# Patient Record
Sex: Male | Born: 2000 | Race: Black or African American | Hispanic: No | Marital: Single | Smoking: Never smoker
Health system: Southern US, Community
[De-identification: ages and names within clinical notes are randomized; demographics above are authoritative.]

## PROBLEM LIST (undated history)

## (undated) DIAGNOSIS — J45909 Unspecified asthma, uncomplicated: Secondary | ICD-10-CM

---
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
## 2020-03-19 ENCOUNTER — Other Ambulatory Visit: Payer: Self-pay

## 2020-03-19 ENCOUNTER — Ambulatory Visit (HOSPITAL_COMMUNITY)
Admission: RE | Admit: 2020-03-19 | Discharge: 2020-03-19 | Disposition: A | Discharge status: Home / Self Care | Payer: Medicaid Other | Source: Ambulatory Visit | Attending: Family Medicine | Admitting: Family Medicine

## 2020-03-19 ENCOUNTER — Encounter (HOSPITAL_COMMUNITY): Payer: Self-pay

## 2020-03-19 VITALS — BP 122/85 | HR 83 | Temp 98.4°F | Resp 16 | Ht 69.0 in | Wt 160.0 lb

## 2020-03-19 DIAGNOSIS — J209 Acute bronchitis, unspecified: Secondary | ICD-10-CM

## 2020-03-19 MED ORDER — BENZONATATE 100 MG PO CAPS: 100.0000 mg | ORAL_CAPSULE | Freq: Four times a day (QID) | ORAL | 0 refills | Status: AC | PRN

## 2020-03-19 MED ORDER — PREDNISONE 50 MG PO TABS: ORAL_TABLET | ORAL | 0 refills | Status: AC

## 2020-03-19 MED ORDER — ALBUTEROL SULFATE HFA 108 (90 BASE) MCG/ACT IN AERS: 2.0000 | INHALATION_SPRAY | Freq: Four times a day (QID) | RESPIRATORY_TRACT | 0 refills | Status: AC | PRN

## 2020-03-19 MED ORDER — AZITHROMYCIN 250 MG PO TABS: ORAL_TABLET | ORAL | 0 refills | Status: AC

## 2020-03-19 NOTE — ED Triage Notes (Signed)
Pt reports Approx. 10 days ago He started having a cough that was productive , HA ,Nausea. Pt reports he still feels bad.

## 2020-03-19 NOTE — ED Provider Notes (Signed)
[facility]    CSN: [id number] Arrival date & time: 03/19/20  0915      History   Chief Complaint Chief Complaint  Patient presents with  . Cough  . Nausea  . Headache    HPI Tony Evans is a 19 y.o. male with history of childhood asthma presents to urgent care with complaints of feeling unwell.  Patient states feeling unwell since last Monday so this would be day 11 of illness.  He is not feeling any worse today however has not improved therefore prompting him to come to the ER.  Patient reports cough productive of thick, green mucus, dull headache, sinus congestion.  Patient denies any fever or chills, no sore throat, no loss of taste or smell.  Patient fully vaccinated with negative Covid PCR done earlier this week.   History reviewed. No pertinent past medical history.  There are no problems to display for this patient.   History reviewed. No pertinent surgical history.     Home Medications    Prior to Admission medications   Medication Sig Start Date End Date Taking? Authorizing Provider  albuterol (VENTOLIN HFA) 108 (90 Base) MCG/ACT inhaler Inhale 2 puffs into the lungs every 6 (six) hours as needed for wheezing or shortness of breath. 03/19/20   Rolla Etienne, NP  azithromycin (ZITHROMAX Z-PAK) 250 MG tablet Take 2 tablets (500 mg) on  Day 1,  followed by 1 tablet (250 mg) once daily on Days 2 through 5. 03/19/20 03/24/20  Rolla Etienne, NP  benzonatate (TESSALON PERLES) 100 MG capsule Take 1 capsule (100 mg total) by mouth every 6 (six) hours as needed for cough. 03/19/20 03/19/21  Rolla Etienne, NP  predniSONE (DELTASONE) 50 MG tablet 1 tab p.o. daily x5 days 03/19/20   Rolla Etienne, NP    Family History History reviewed. No pertinent family history.  Social History Social History   Tobacco Use  . Smoking status: Never Smoker  . Smokeless tobacco: Never Used  Substance Use Topics  . Alcohol use: Yes    Comment: social  . Drug use: Never      Allergies   Patient has no allergy information on record.   Review of Systems As stated in HPI otherwise negative   Physical Exam Triage Vital Signs ED Triage Vitals  Enc Vitals Group     BP 03/19/20 1009 122/85     Pulse Rate 03/19/20 1009 83     Resp 03/19/20 1009 16     Temp 03/19/20 1009 98.4 F (36.9 C)     Temp Source 03/19/20 1009 Oral     SpO2 03/19/20 1009 100 %     Weight 03/19/20 1011 160 lb (72.6 kg)     Height 03/19/20 1011 5\' 9"  (1.753 m)     Head Circumference --      Peak Flow --      Pain Score 03/19/20 1014 0     Pain Loc --      Pain Edu? --      Excl. in GC? --    No data found.  Updated Vital Signs BP 122/85 (BP Location: Right Arm)   Pulse 83   Temp 98.4 F (36.9 C) (Oral)   Resp 16   Ht 5\' 9"  (1.753 m)   Wt 72.6 kg   SpO2 100%   BMI 23.63 kg/m   Visual Acuity Right Eye Distance:   Left Eye Distance:   Bilateral Distance:  Right Eye Near:   Left Eye Near:    Bilateral Near:     Physical Exam Constitutional:      General: He is not in acute distress.    Appearance: He is well-developed and normal weight. He is not ill-appearing.  HENT:     Mouth/Throat:     Mouth: Mucous membranes are moist.     Pharynx: Oropharynx is clear.  Cardiovascular:     Rate and Rhythm: Normal rate and regular rhythm.  Pulmonary:     Effort: Pulmonary effort is normal.     Comments: Bilateral expiratory wheeze, mild.  No rales or crackles no increased work of breathing Musculoskeletal:     Cervical back: Normal range of motion and neck supple.  Lymphadenopathy:     Cervical: No cervical adenopathy.  Skin:    General: Skin is warm and dry.  Neurological:     Mental Status: He is alert.  Psychiatric:        Mood and Affect: Mood normal.        Behavior: Behavior normal.      UC Treatments / Results  Labs (all labs ordered are listed, but only abnormal results are displayed) Labs Reviewed - No data to  display  EKG   Radiology No results found.  Procedures Procedures (including critical care time)  Medications Ordered in UC Medications - No data to display  Initial Impression / Assessment and Plan / UC Course  I have reviewed the triage vital signs and the nursing notes.  Pertinent labs & imaging results that were available during my care of the patient were reviewed by me and considered in my medical decision making (see chart for details).  Acute Bronchitis -Suspect viral but given duration of symptoms will treat empirically with azithromycin -Prednisone burst, albuterol inhaler as needed, Tessalon Perles as needed -Covid PCR negative earlier this week therefore no need to repeat - follow-up for any worsening symptoms  Reviewed expections re: course of current medical issues. Questions answered. Outlined signs and symptoms indicating need for more acute intervention. Pt verbalized understanding. AVS given   Final Clinical Impressions(s) / UC Diagnoses   Final diagnoses:  Acute bronchitis, unspecified organism     Discharge Instructions     Take antibiotic and steroid as prescribed.  Use albuterol inhaler as needed for any shortness of breath or persistent coughing.  I am also prescribing you Tessalon Perles to suppress cough.  Please follow-up for any worsening symptoms.    ED Prescriptions    Medication Sig Dispense Auth. Provider   azithromycin (ZITHROMAX Z-PAK) 250 MG tablet Take 2 tablets (500 mg) on  Day 1,  followed by 1 tablet (250 mg) once daily on Days 2 through 5. 6 each Rolla Etienne, NP   predniSONE (DELTASONE) 50 MG tablet 1 tab p.o. daily x5 days 5 tablet Rolla Etienne, NP   albuterol (VENTOLIN HFA) 108 (90 Base) MCG/ACT inhaler Inhale 2 puffs into the lungs every 6 (six) hours as needed for wheezing or shortness of breath. 8 g Rolla Etienne, NP   benzonatate (TESSALON PERLES) 100 MG capsule Take 1 capsule (100 mg total) by mouth every 6 (six)  hours as needed for cough. 30 capsule Rolla Etienne, NP     PDMP not reviewed this encounter.   Rolla Etienne, NP 03/19/20 1350

## 2020-03-19 NOTE — Discharge Instructions (Signed)
Take antibiotic and steroid as prescribed.  Use albuterol inhaler as needed for any shortness of breath or persistent coughing.  I am also prescribing you Tessalon Perles to suppress cough.  Please follow-up for any worsening symptoms.

## 2020-04-07 ENCOUNTER — Encounter (HOSPITAL_COMMUNITY): Payer: Self-pay | Admitting: Emergency Medicine

## 2020-04-08 ENCOUNTER — Emergency Department (HOSPITAL_COMMUNITY): Payer: Medicaid Other

## 2020-04-08 ENCOUNTER — Emergency Department (HOSPITAL_COMMUNITY)
Admission: EM | Admit: 2020-04-08 | Discharge: 2020-04-09 | Disposition: A | Discharge status: Home / Self Care | Payer: Medicaid Other | Attending: Emergency Medicine | Admitting: Emergency Medicine

## 2020-04-08 ENCOUNTER — Encounter (HOSPITAL_COMMUNITY): Payer: Self-pay

## 2020-04-08 ENCOUNTER — Other Ambulatory Visit: Payer: Self-pay

## 2020-04-08 DIAGNOSIS — R062 Wheezing: Secondary | ICD-10-CM | POA: Insufficient documentation

## 2020-04-08 DIAGNOSIS — R059 Cough, unspecified: Secondary | ICD-10-CM | POA: Diagnosis present

## 2020-04-08 DIAGNOSIS — R0602 Shortness of breath: Secondary | ICD-10-CM | POA: Insufficient documentation

## 2020-04-08 DIAGNOSIS — Z20822 Contact with and (suspected) exposure to covid-19: Secondary | ICD-10-CM | POA: Insufficient documentation

## 2020-04-08 NOTE — ED Triage Notes (Signed)
Pt states that he was seen at UC two weeks ago and diagnosed with bronchitis, given antibiotics that he completed, coughing, congestion, and SOB continues. Pt is fully vaccinated for covid and had negative covid test two weeks ago.

## 2020-04-09 LAB — RESP PANEL BY RT-PCR (FLU A&B, COVID) ARPGX2
Influenza A by PCR: NEGATIVE
Influenza B by PCR: NEGATIVE
SARS Coronavirus 2 by RT PCR: NEGATIVE

## 2020-04-09 MED ORDER — PREDNISONE 20 MG PO TABS
20.0000 mg | ORAL_TABLET | Freq: Once | ORAL | Status: AC
  Administered 2020-04-09: 20 mg via ORAL
  Filled 2020-04-09: qty 1

## 2020-04-09 MED ORDER — ALBUTEROL SULFATE HFA 108 (90 BASE) MCG/ACT IN AERS
2.0000 | INHALATION_SPRAY | Freq: Once | RESPIRATORY_TRACT | Status: AC
  Administered 2020-04-09: 2 via RESPIRATORY_TRACT
  Filled 2020-04-09: qty 6.7

## 2020-04-09 NOTE — ED Provider Notes (Signed)
[facility] EMERGENCY DEPARTMENT Provider Note  CSN: [id number] Arrival date & time: 04/08/20 2222  Chief Complaint(s) Cough  HPI Tony Evans is a 19 y.o. male with a history of asthma who presents to the emergency department for cough and mild shortness of breath.  He reports that he was diagnosed with bronchitis several weeks ago and given an albuterol inhaler.  This has helped him however several days ago his cough returned.  He is getting relief with intermittent use of albuterol however short lasting.  He denies any fevers or chills.  The cough is dry nonproductive.  No chest pain.  No other physical complaints.  HPI  Past Medical History Past Medical History:  Diagnosis Date  . Asthma    There are no problems to display for this patient.  Home Medication(s) Prior to Admission medications   Medication Sig Start Date End Date Taking? Authorizing Provider  albuterol (VENTOLIN HFA) 108 (90 Base) MCG/ACT inhaler Inhale 2 puffs into the lungs every 6 (six) hours as needed for wheezing or shortness of breath. 03/19/20   Rolla Etienne, NP  benzonatate (TESSALON PERLES) 100 MG capsule Take 1 capsule (100 mg total) by mouth every 6 (six) hours as needed for cough. 03/19/20 03/19/21  Rolla Etienne, NP  LORazepam (ATIVAN) 1 MG tablet Take 1 tablet (1 mg total) by mouth 3 (three) times daily as needed for anxiety. 04/08/18   Ward, Layla Maw, DO  predniSONE (DELTASONE) 50 MG tablet 1 tab p.o. daily x5 days 03/19/20   Rolla Etienne, NP                                                                                                                                    Past Surgical History History reviewed. No pertinent surgical history. Family History No family history on file.  Social History Social History   Tobacco Use  . Smoking status: Never Smoker  . Smokeless tobacco: Never Used  Substance Use Topics  . Alcohol use: Yes    Comment: social  . Drug use: Never    Allergies Banana  Review of Systems Review of Systems All other systems are reviewed and are negative for acute change except as noted in the HPI  Physical Exam Vital Signs  I have reviewed the triage vital signs BP 131/80 (BP Location: Right Arm)   Pulse 94   Temp 98 F (36.7 C) (Oral)   Resp 18   SpO2 99%   Physical Exam Vitals reviewed.  Constitutional:      General: He is not in acute distress.    Appearance: He is well-developed. He is not diaphoretic.  HENT:     Head: Normocephalic and atraumatic.     Nose: Nose normal.  Eyes:     General: No scleral icterus.       Right eye: No discharge.  Left eye: No discharge.     Conjunctiva/sclera: Conjunctivae normal.     Pupils: Pupils are equal, round, and reactive to light.  Cardiovascular:     Rate and Rhythm: Normal rate and regular rhythm.     Heart sounds: No murmur heard.  No friction rub. No gallop.   Pulmonary:     Effort: Pulmonary effort is normal. No respiratory distress.     Breath sounds: No stridor. Examination of the right-middle field reveals wheezing. Examination of the left-middle field reveals wheezing. Examination of the right-lower field reveals wheezing. Examination of the left-lower field reveals wheezing. Wheezing present. No rales.  Abdominal:     General: There is no distension.     Palpations: Abdomen is soft.     Tenderness: There is no abdominal tenderness.  Musculoskeletal:        General: No tenderness.     Cervical back: Normal range of motion and neck supple.  Skin:    General: Skin is warm and dry.     Findings: No erythema or rash.  Neurological:     Mental Status: He is alert and oriented to person, place, and time.     ED Results and Treatments Labs (all labs ordered are listed, but only abnormal results are displayed) Labs Reviewed  RESP PANEL BY RT-PCR (FLU A&B, COVID) [id number]                                                                                                                          EKG  EKG Interpretation  Date/Time:    Ventricular Rate:    PR Interval:    QRS Duration:   QT Interval:    QTC Calculation:   R Axis:     Text Interpretation:        Radiology DG Chest Portable 1 View  Result Date: 04/08/2020 CLINICAL DATA:  Cough. EXAM: PORTABLE CHEST 1 VIEW COMPARISON:  April 08, 2018 FINDINGS: The heart size and mediastinal contours are within normal limits. Both lungs are clear. The visualized skeletal structures are unremarkable. IMPRESSION: No active disease. Electronically Signed   By: Aram Candela M.D.   On: 04/08/2020 23:19    Pertinent labs & imaging results that were available during my care of the patient were reviewed by me and considered in my medical decision making (see chart for details).  Medications Ordered in ED Medications  predniSONE (DELTASONE) tablet 20 mg (20 mg Oral Given 04/09/20 0327)  albuterol (VENTOLIN HFA) 108 (90 Base) MCG/ACT inhaler 2 puff (2 puffs Inhalation Given 04/09/20 0326)  Procedures Procedures  (including critical care time)  Medical Decision Making / ED Course I have reviewed the nursing notes for this encounter and the patient's prior records (if available in EHR or on provided paperwork).   Tony Evans was evaluated in Emergency Department on 04/09/2020 for the symptoms described in the history of present illness. He was evaluated in the context of the global COVID-19 pandemic, which necessitated consideration that the patient might be at risk for infection with the SARS-CoV-2 virus that causes COVID-19. Institutional protocols and algorithms that pertain to the evaluation of patients at risk for COVID-19 are in a state of rapid change based on information released by regulatory bodies including the CDC and federal and state organizations.  These policies and algorithms were followed during the patient's care in the ED.  Mild asthma exacerbation. CXR negative. PCR negative for Covid and influenza. Patient given prednisone and albuterol inhaler.  Prior to reassessment, patient eloped      Final Clinical Impression(s) / ED Diagnoses Final diagnoses:  Wheezy      This chart was dictated using voice recognition software.  Despite best efforts to proofread,  errors can occur which can change the documentation meaning.   Nira Conn, MD 04/09/20 [phone]

## 2020-04-09 NOTE — ED Notes (Addendum)
Pt states he would like to leave with receiving any further evaluation or treatment.

## 2022-06-12 IMAGING — DX DG CHEST 1V PORT
1 series · 1 of 1 positions shown · non-contrast
Comparison: April 08, 2018

CLINICAL DATA: Cough.

EXAM:
PORTABLE CHEST 1 VIEW

[chest ap]
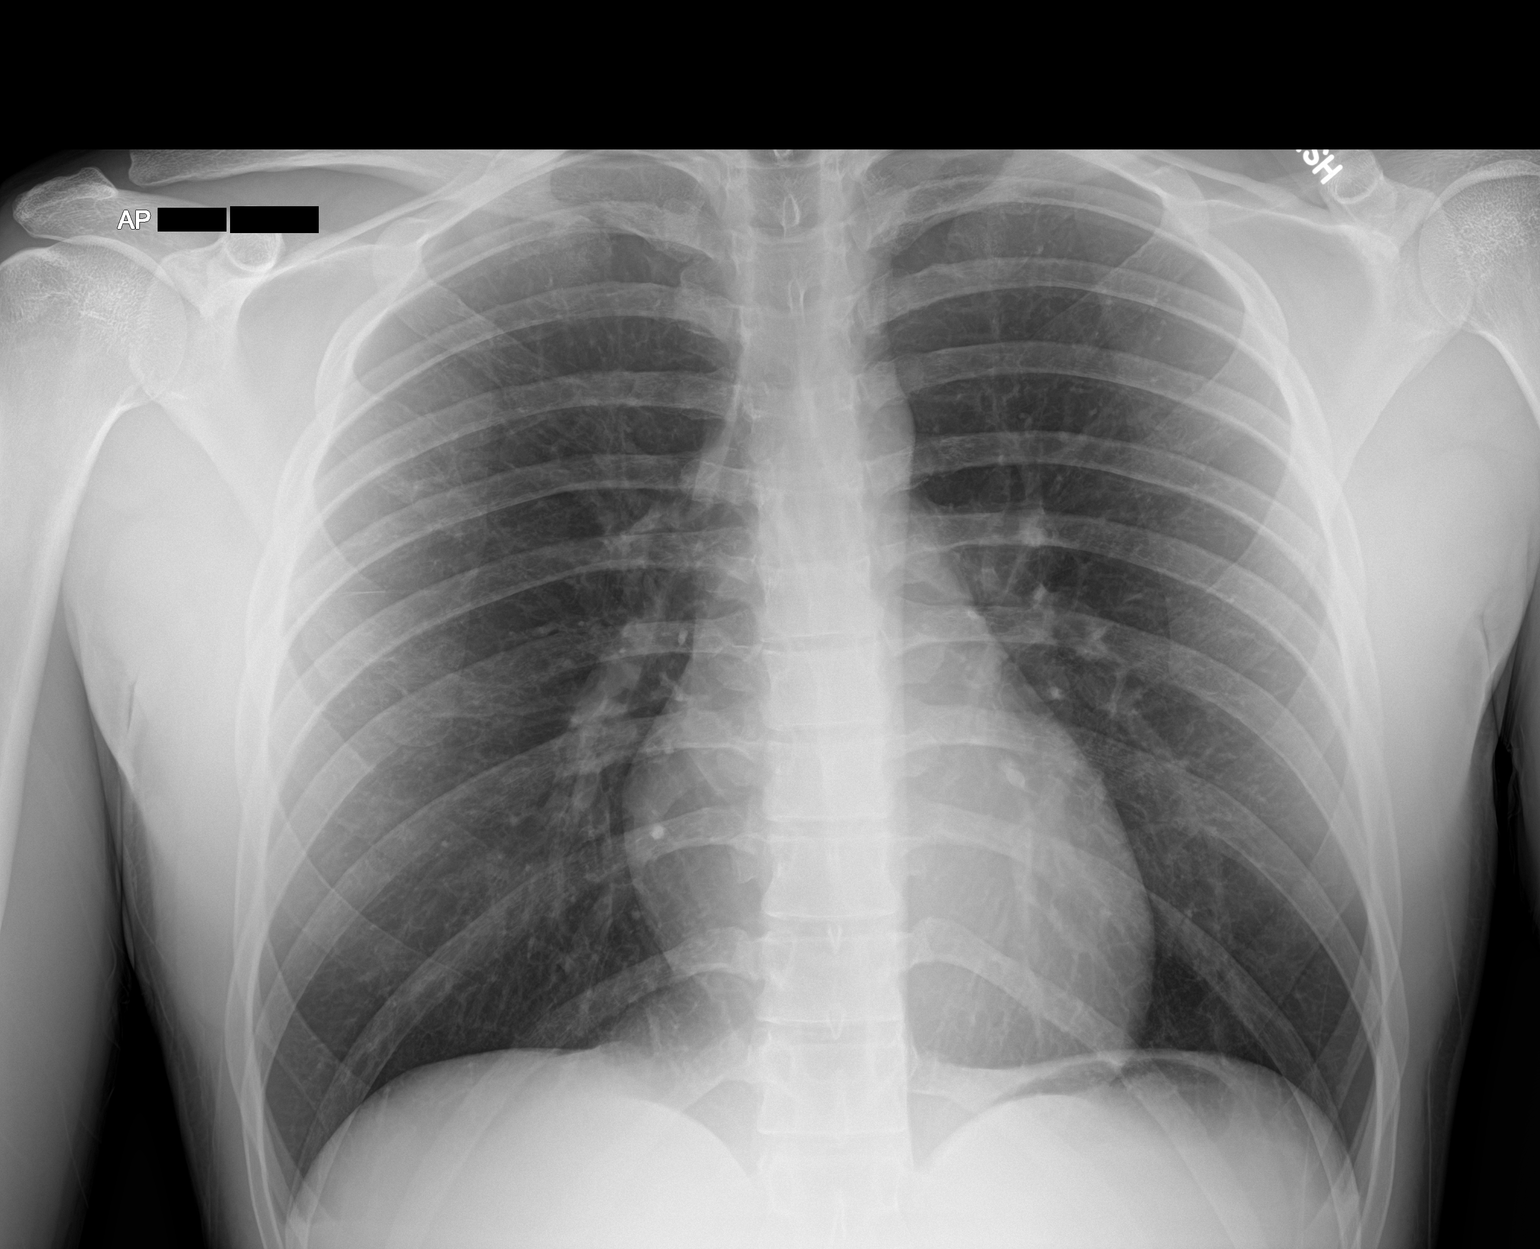

[1 of 1 positions shown; findings below may reference images not displayed]

FINDINGS: The heart size and mediastinal contours are within normal limits.
Both lungs are clear. The visualized skeletal structures are
unremarkable.
IMPRESSION: No active disease.
